# Patient Record
Sex: Female | Born: 1965 | Race: White | Hispanic: No
Health system: Midwestern US, Community
[De-identification: ages and names within clinical notes are randomized; demographics above are authoritative.]

## PROBLEM LIST (undated history)

## (undated) DIAGNOSIS — R002 Palpitations: Secondary | ICD-10-CM

## (undated) DIAGNOSIS — I10 Essential (primary) hypertension: Principal | ICD-10-CM

---

## 2007-07-11 ENCOUNTER — Emergency Department (HOSPITAL_COMMUNITY): Admission: EM | Admit: 2007-07-11 | Discharge: 2007-07-11 | Payer: Self-pay | Admitting: Emergency Medicine

## 2008-01-13 ENCOUNTER — Emergency Department (HOSPITAL_COMMUNITY): Admission: EM | Admit: 2008-01-13 | Discharge: 2008-01-14 | Payer: Self-pay | Admitting: Emergency Medicine

## 2009-02-25 ENCOUNTER — Ambulatory Visit: Payer: Self-pay | Admitting: Obstetrics and Gynecology

## 2009-02-25 ENCOUNTER — Inpatient Hospital Stay (HOSPITAL_COMMUNITY): Admission: AD | Admit: 2009-02-25 | Discharge: 2009-02-25 | Payer: Self-pay | Admitting: Obstetrics and Gynecology

## 2010-04-27 IMAGING — CR DG ABDOMEN ACUTE W/ 1V CHEST
3 series · 3 of 3 positions shown · non-contrast
Comparison: 07/11/2007

CLINICAL DATA: Nausea.  Vomiting.  Shortness of breath.  Abdominal
swelling.

ACUTE ABDOMEN SERIES (ABDOMEN 2 VIEW & CHEST 1 VIEW)

[w chest pa]
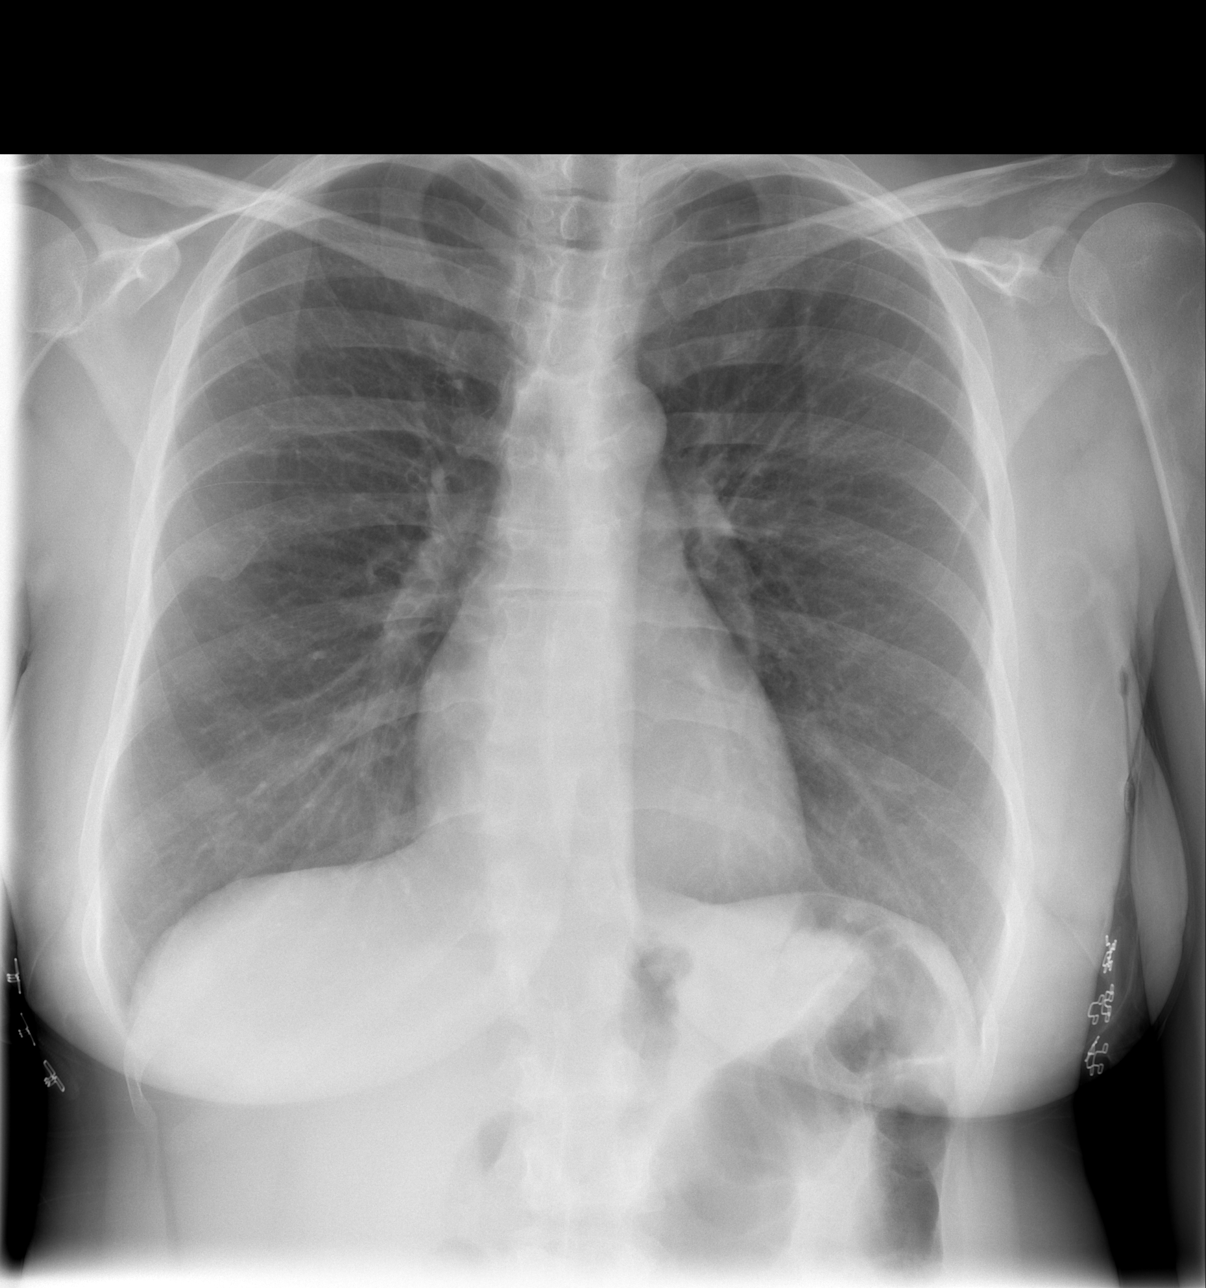

[w abdomen upright]
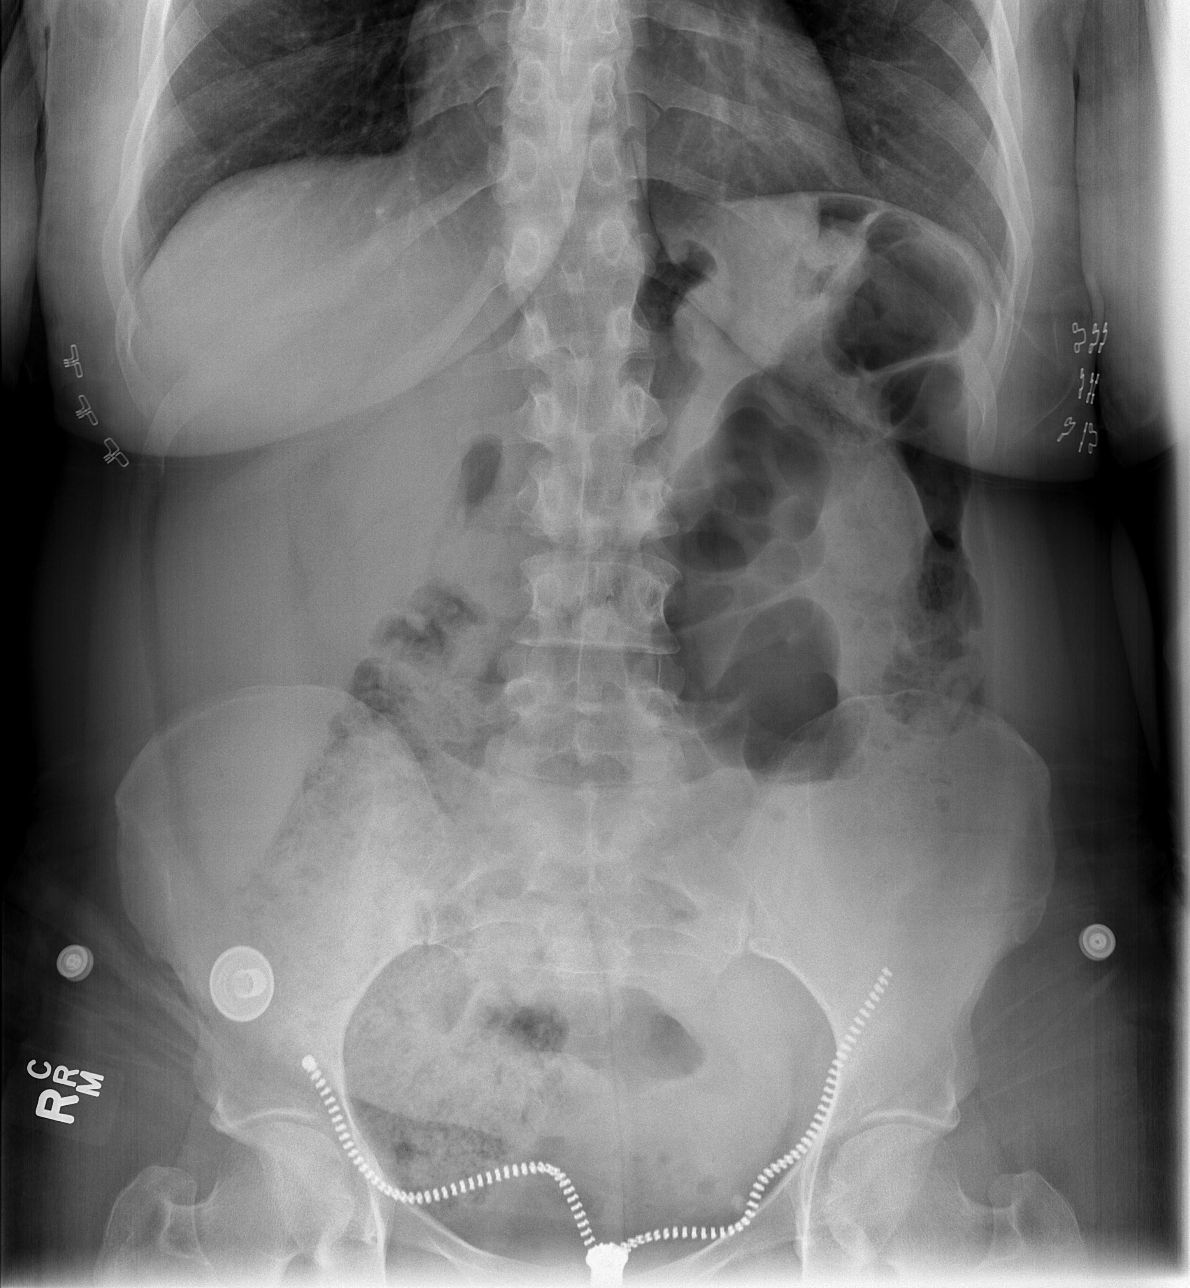

[t abdomen supine]
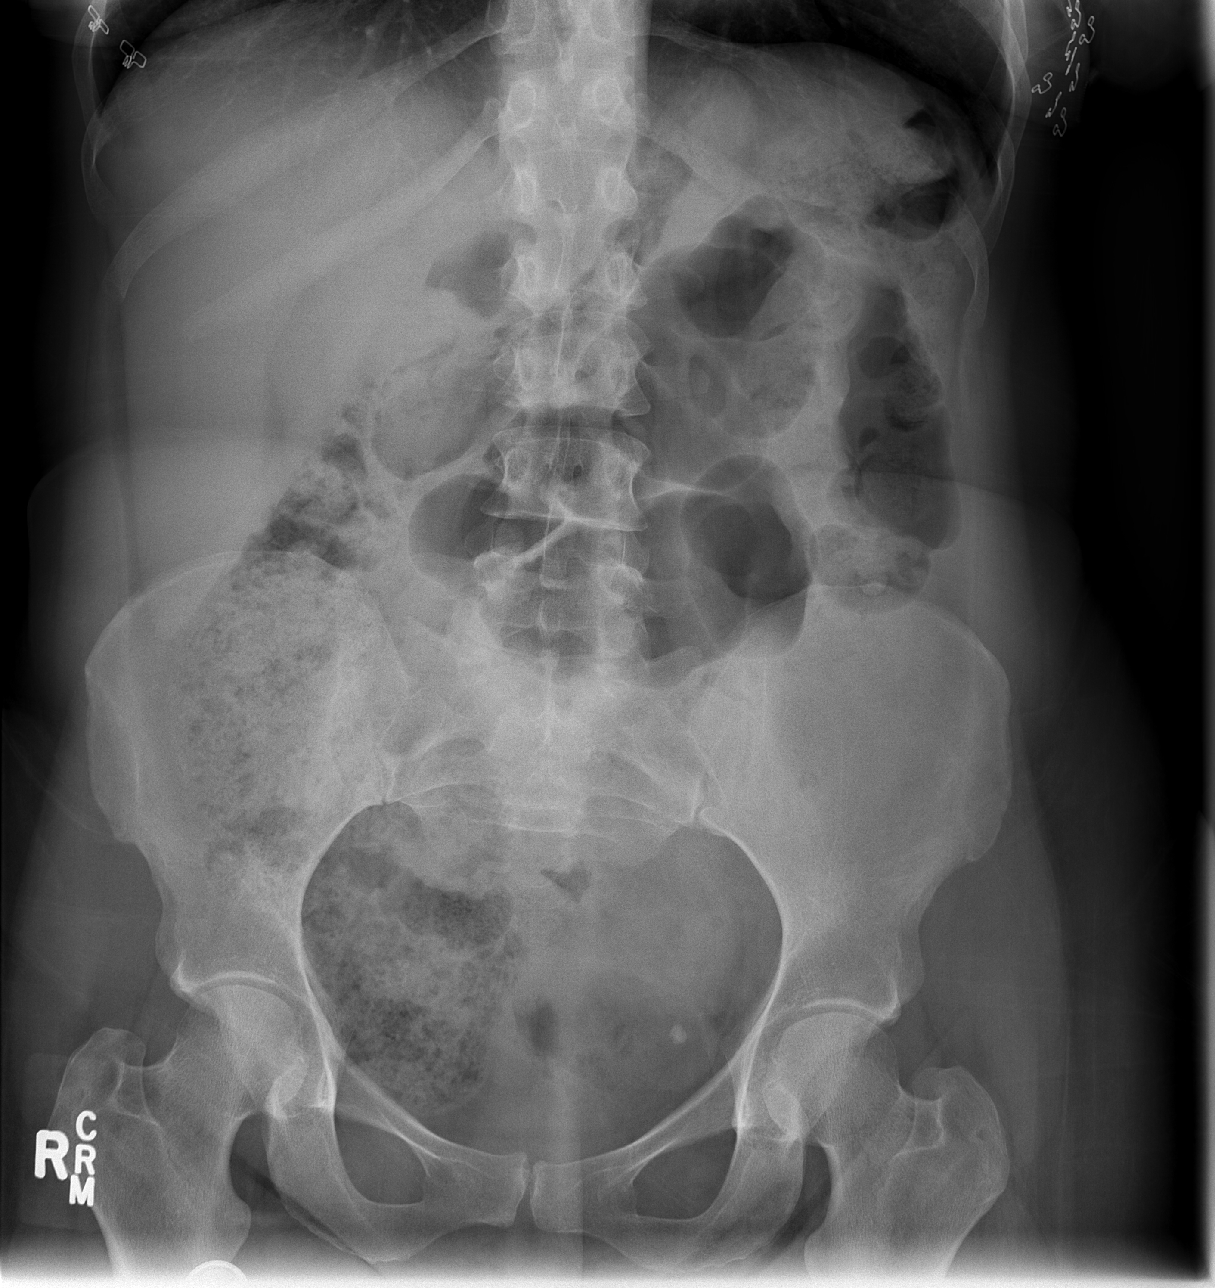

[3 of 3 positions shown; findings below may reference images not displayed]

FINDINGS: Cardiac and mediastinal contours appear unremarkable.
The lungs appear clear.

No free intraperitoneal gas is evident beneath the hemidiaphragms.
There is mild prominence of stool in the right colon with gas and
stool in the remainder of the colon.  No dilated small bowel is
identified.

No significant abnormal calcific density is identified.  Borderline
prominence of the right hepatic lobe.
IMPRESSION: 1.  Mild prominence of gas and stool in the colon, especially
proximally.  Constipation could have this appearance.
2.  No dilated small bowel to suggest obstruction.

## 2010-07-07 LAB — URINALYSIS, ROUTINE W REFLEX MICROSCOPIC
Glucose, UA: NEGATIVE mg/dL
Ketones, ur: NEGATIVE mg/dL
Nitrite: POSITIVE — AB
Protein, ur: NEGATIVE mg/dL
Specific Gravity, Urine: 1.02 (ref 1.005–1.030)
pH: 5 (ref 5.0–8.0)

## 2010-07-07 LAB — URINE MICROSCOPIC-ADD ON

## 2010-07-07 LAB — URINE CULTURE

## 2010-12-28 LAB — URINALYSIS, ROUTINE W REFLEX MICROSCOPIC
Bilirubin Urine: NEGATIVE
Ketones, ur: >80 — AB
Leukocytes, UA: NEGATIVE
Protein, ur: 30 — AB
Specific Gravity, Urine: 1.015
pH: 5.5

## 2010-12-28 LAB — CBC
MCHC: 32.2
Platelets: 233
RDW: 14.9

## 2010-12-28 LAB — POCT I-STAT, CHEM 8: Calcium, Ion: 1.12

## 2010-12-28 LAB — PREGNANCY, URINE: Preg Test, Ur: NEGATIVE

## 2010-12-28 LAB — URINE MICROSCOPIC-ADD ON

## 2010-12-28 LAB — DIFFERENTIAL
Lymphs Abs: 0.9
Monocytes Relative: 11
Neutro Abs: 4.1

## 2011-01-03 LAB — POCT I-STAT, CHEM 8
BUN: 11
Calcium, Ion: 1.17
Chloride: 109
Creatinine, Ser: 0.8
Glucose, Bld: 100 — ABNORMAL HIGH
HCT: 36
Hemoglobin: 12.2
Potassium: 4
Sodium: 140
TCO2: 24

## 2011-01-03 LAB — CBC
HCT: 34.9 — ABNORMAL LOW
Platelets: 331
WBC: 11.3 — ABNORMAL HIGH

## 2011-01-03 LAB — D-DIMER, QUANTITATIVE: D-Dimer, Quant: 0.41

## 2011-01-03 LAB — URINALYSIS, ROUTINE W REFLEX MICROSCOPIC
Ketones, ur: NEGATIVE
Nitrite: NEGATIVE
Protein, ur: NEGATIVE
Urobilinogen, UA: 0.2
pH: 5.5

## 2011-01-03 LAB — DIFFERENTIAL
Eosinophils Absolute: 0.3
Lymphs Abs: 1.9
Neutrophils Relative %: 72

## 2011-01-03 LAB — URINE CULTURE
Colony Count: NO GROWTH
Culture: NO GROWTH

## 2011-01-03 LAB — PROTIME-INR: INR: 0.9

## 2011-01-03 LAB — POCT PREGNANCY, URINE: Preg Test, Ur: NEGATIVE

## 2011-01-03 LAB — POCT CARDIAC MARKERS
CKMB, poc: 1.3
CKMB, poc: 1.6

## 2018-02-01 ENCOUNTER — Emergency Department (HOSPITAL_COMMUNITY)
Admission: EM | Admit: 2018-02-01 | Discharge: 2018-02-01 | Disposition: A | Discharge status: Home / Self Care | Payer: BLUE CROSS/BLUE SHIELD | Attending: Emergency Medicine | Admitting: Emergency Medicine

## 2018-02-01 ENCOUNTER — Emergency Department (HOSPITAL_COMMUNITY): Payer: BLUE CROSS/BLUE SHIELD

## 2018-02-01 ENCOUNTER — Encounter (HOSPITAL_COMMUNITY): Payer: Self-pay | Admitting: Emergency Medicine

## 2018-02-01 DIAGNOSIS — F17210 Nicotine dependence, cigarettes, uncomplicated: Secondary | ICD-10-CM | POA: Insufficient documentation

## 2018-02-01 DIAGNOSIS — J069 Acute upper respiratory infection, unspecified: Secondary | ICD-10-CM | POA: Diagnosis not present

## 2018-02-01 DIAGNOSIS — R05 Cough: Secondary | ICD-10-CM | POA: Diagnosis present

## 2018-02-01 LAB — INFLUENZA PANEL BY PCR (TYPE A & B)
Influenza A By PCR: NEGATIVE
Influenza B By PCR: NEGATIVE

## 2018-02-01 LAB — CBC WITH DIFFERENTIAL/PLATELET
ABS IMMATURE GRANULOCYTES: 0.02 10*3/uL (ref 0.00–0.07)
BASOS ABS: 0 10*3/uL (ref 0.0–0.1)
Basophils Relative: 0 %
EOS PCT: 1 %
Eosinophils Absolute: 0 10*3/uL (ref 0.0–0.5)
HCT: 33.1 % — ABNORMAL LOW (ref 36.0–46.0)
Hemoglobin: 10.8 g/dL — ABNORMAL LOW (ref 12.0–15.0)
IMMATURE GRANULOCYTES: 0 %
Lymphocytes Relative: 24 %
Lymphs Abs: 1.7 10*3/uL (ref 0.7–4.0)
MCH: 26 pg (ref 26.0–34.0)
MCHC: 32.6 g/dL (ref 30.0–36.0)
MCV: 79.6 fL — ABNORMAL LOW (ref 80.0–100.0)
Monocytes Absolute: 0.7 10*3/uL (ref 0.1–1.0)
Monocytes Relative: 10 %
NEUTROS ABS: 4.7 10*3/uL (ref 1.7–7.7)
NEUTROS PCT: 65 %
NRBC: 0 % (ref 0.0–0.2)
Platelets: 278 10*3/uL (ref 150–400)
RBC: 4.16 MIL/uL (ref 3.87–5.11)
RDW: 14 % (ref 11.5–15.5)
WBC: 7.3 10*3/uL (ref 4.0–10.5)

## 2018-02-01 LAB — COMPREHENSIVE METABOLIC PANEL
ALBUMIN: 4.2 g/dL (ref 3.5–5.0)
ALK PHOS: 56 U/L (ref 38–126)
ALT: 12 U/L (ref 0–44)
ANION GAP: 6 (ref 5–15)
AST: 18 U/L (ref 15–41)
BUN: 9 mg/dL (ref 6–20)
CALCIUM: 9.3 mg/dL (ref 8.9–10.3)
CO2: 25 mmol/L (ref 22–32)
Chloride: 107 mmol/L (ref 98–111)
Creatinine, Ser: 0.78 mg/dL (ref 0.44–1.00)
GFR calc Af Amer: >60 mL/min (ref >60)
GFR calc non Af Amer: >60 mL/min (ref >60)
GLUCOSE: 90 mg/dL (ref 70–99)
POTASSIUM: 3.6 mmol/L (ref 3.5–5.1)
SODIUM: 138 mmol/L (ref 135–145)
Total Bilirubin: 0.5 mg/dL (ref 0.3–1.2)
Total Protein: 7.7 g/dL (ref 6.5–8.1)

## 2018-02-01 MED ORDER — IBUPROFEN 800 MG PO TABS: 800.0000 mg | ORAL_TABLET | Freq: Three times a day (TID) | ORAL | 0 refills | Status: AC | PRN

## 2018-02-01 MED ORDER — DOXYCYCLINE HYCLATE 100 MG PO CAPS: 100.0000 mg | ORAL_CAPSULE | Freq: Two times a day (BID) | ORAL | 0 refills | Status: AC

## 2018-02-01 MED ORDER — ONDANSETRON HCL 4 MG/2ML IJ SOLN
4.0000 mg | Freq: Once | INTRAMUSCULAR | Status: AC
  Administered 2018-02-01: 4 mg via INTRAVENOUS
  Filled 2018-02-01: qty 2

## 2018-02-01 MED ORDER — KETOROLAC TROMETHAMINE 30 MG/ML IJ SOLN
30.0000 mg | Freq: Once | INTRAMUSCULAR | Status: AC
  Administered 2018-02-01: 30 mg via INTRAVENOUS
  Filled 2018-02-01: qty 1

## 2018-02-01 MED ORDER — ONDANSETRON 4 MG PO TBDP: ORAL_TABLET | ORAL | 0 refills | Status: AC

## 2018-02-01 MED ORDER — SODIUM CHLORIDE 0.9 % IV BOLUS
1000.0000 mL | Freq: Once | INTRAVENOUS | Status: AC
  Administered 2018-02-01: 1000 mL via INTRAVENOUS

## 2018-02-01 NOTE — ED Triage Notes (Addendum)
Pt c/o being fatigue, headache cough, body aches for about week. Pt was told boarderline diabetic when checked this morning fasting was 89. Pt also having anxiousness and SOb at times. reports lots of sweating last night.  Pt adds that she has fibromyalgia and assumes that is what her pains are from

## 2018-02-01 NOTE — ED Notes (Signed)
Patient transported to X-ray 

## 2018-02-01 NOTE — Discharge Instructions (Addendum)
Drink plenty of fluids.  Take Tylenol for fever.  Follow-up with your family doctor if not improving

## 2018-02-01 NOTE — ED Provider Notes (Signed)
[facility]-EMERGENCY DEPT Provider Note   CSN: [id number] Arrival date & time: 02/01/18  0941     History   Chief Complaint Chief Complaint  Patient presents with  . Fatigue  . Anxiety  . Cough  . Headache    HPI April Morris is a 52 y.o. female.  Patient complains of cough congestion and aching all over  The history is provided by the patient. No language interpreter was used.  Cough  This is a new problem. The current episode started 2 days ago. The problem occurs constantly. The problem has not changed since onset.The cough is non-productive. There has been no fever (No fever). Pertinent negatives include no chest pain and no headaches. She has tried nothing for the symptoms. The treatment provided no relief. Risk factors: Unknown. She is not a smoker. Her past medical history does not include pneumonia.    History reviewed. No pertinent past medical history.  There are no active problems to display for this patient.   History reviewed. No pertinent surgical history.   OB History   None      Home Medications    Prior to Admission medications   Medication Sig Start Date End Date Taking? Authorizing Provider  ALPRAZolam Prudy Feeler) 1 MG tablet Take 1 mg by mouth 3 (three) times daily as needed for anxiety.   Yes [provider]  cholecalciferol (VITAMIN D) 1000 units tablet Take 1,000 Units by mouth once a week.   Yes [provider]  magnesium 30 MG tablet Take 30 mg by mouth once.   Yes [provider]  omeprazole (PRILOSEC) 40 MG capsule Take 40 mg by mouth daily.   Yes [provider]  doxycycline (VIBRAMYCIN) 100 MG capsule Take 1 capsule (100 mg total) by mouth 2 (two) times daily. One po bid x 7 days 02/01/18   Bethann Berkshire, MD  ibuprofen (ADVIL,MOTRIN) 800 MG tablet Take 1 tablet (800 mg total) by mouth every 8 (eight) hours as needed. 02/01/18   Bethann Berkshire, MD  ondansetron (ZOFRAN ODT) 4 MG  disintegrating tablet 4mg  ODT q4 hours prn nausea/vomit 02/01/18   Bethann Berkshire, MD    Family History No family history on file.  Social History Social History   Tobacco Use  . Smoking status: Current Every Day Smoker    Types: Cigarettes  . Smokeless tobacco: Never Used  Substance Use Topics  . Alcohol use: Not on file  . Drug use: Not on file     Allergies   Patient has no known allergies.   Review of Systems Review of Systems  Constitutional: Negative for appetite change and fatigue.  HENT: Negative for congestion, ear discharge and sinus pressure.   Eyes: Negative for discharge.  Respiratory: Positive for cough.   Cardiovascular: Negative for chest pain.  Gastrointestinal: Negative for abdominal pain and diarrhea.  Genitourinary: Negative for frequency and hematuria.  Musculoskeletal: Negative for back pain.  Skin: Negative for rash.  Neurological: Negative for seizures and headaches.  Psychiatric/Behavioral: Negative for hallucinations.     Physical Exam Updated Vital Signs BP 115/77   Pulse 74   Temp 99.3 F (37.4 C) (Oral)   Resp 15   SpO2 100%   Physical Exam  Constitutional: She is oriented to person, place, and time. She appears well-developed.  HENT:  Head: Normocephalic.  Eyes: Conjunctivae and EOM are normal. No scleral icterus.  Neck: Neck supple. No thyromegaly present.  Cardiovascular: Normal rate and regular rhythm. Exam  reveals no gallop and no friction rub.  No murmur heard. Pulmonary/Chest: No stridor. She has no wheezes. She has no rales. She exhibits no tenderness.  Abdominal: She exhibits no distension. There is no tenderness. There is no rebound.  Musculoskeletal: Normal range of motion. She exhibits no edema.  Lymphadenopathy:    She has no cervical adenopathy.  Neurological: She is oriented to person, place, and time. She exhibits normal muscle tone. Coordination normal.  Skin: No rash noted. No erythema.  Psychiatric: She has  a normal mood and affect. Her behavior is normal.     ED Treatments / Results  Labs (all labs ordered are listed, but only abnormal results are displayed) Labs Reviewed  CBC WITH DIFFERENTIAL/PLATELET - Abnormal; Notable for the following components:      Result Value   Hemoglobin 10.8 (*)    HCT 33.1 (*)    MCV 79.6 (*)    All other components within normal limits  COMPREHENSIVE METABOLIC PANEL  INFLUENZA PANEL BY PCR (TYPE A & B)    EKG None  Radiology Dg Chest 2 View  Result Date: 02/01/2018 CLINICAL DATA:  Increased weakness over the last 24 hours, some in is i.e., cough, slight shortness of breath EXAM: CHEST - 2 VIEW COMPARISON:  Chest x-ray of 12/16/2014 FINDINGS: No pneumonia or effusion is seen. The lungs are slightly hyperaerated. There is some peribronchial thickening however which may indicate bronchitis. Mediastinal and hilar contours are unremarkable. The heart is within normal limits in size. No bony abnormality is seen. IMPRESSION: 1. Peribronchial thickening may indicate bronchitis. 2. Slight hyper aeration.  No pneumonia or pleural effusion. Electronically Signed   By: Dwyane Dee M.D.   On: 02/01/2018 12:22    Procedures Procedures (including critical care time)  Medications Ordered in ED Medications  sodium chloride 0.9 % bolus 1,000 mL (1,000 mLs Intravenous New Bag/Given 02/01/18 1151)  ondansetron (ZOFRAN) injection 4 mg (4 mg Intravenous Given 02/01/18 1152)  ketorolac (TORADOL) 30 MG/ML injection 30 mg (30 mg Intravenous Given 02/01/18 1152)     Initial Impression / Assessment and Plan / ED Course  I have reviewed the triage vital signs and the nursing notes.  Pertinent labs & imaging results that were available during my care of the patient were reviewed by me and considered in my medical decision making (see chart for details).     Patient with bronchitis.  Labs unremarkable.  Patient will be placed on doxycycline Motrin and Zofran will  follow-up with PCP  Final Clinical Impressions(s) / ED Diagnoses   Final diagnoses:  Viral upper respiratory tract infection    ED Discharge Orders         Ordered    doxycycline (VIBRAMYCIN) 100 MG capsule  2 times daily     02/01/18 1311    ibuprofen (ADVIL,MOTRIN) 800 MG tablet  Every 8 hours PRN     02/01/18 1311    ondansetron (ZOFRAN ODT) 4 MG disintegrating tablet     02/01/18 1311           Bethann Berkshire, MD 02/01/18 1319

## 2018-02-01 NOTE — ED Notes (Signed)
ED Provider at bedside. 

## 2020-05-16 IMAGING — CR DG CHEST 2V
2 series · 2 of 2 positions shown · non-contrast
Comparison: Chest x-ray of 12/16/2014

CLINICAL DATA: Increased weakness over the last 24 hours, some in
is i.e., cough, slight shortness of breath

EXAM:
CHEST - 2 VIEW

[x chest ap]
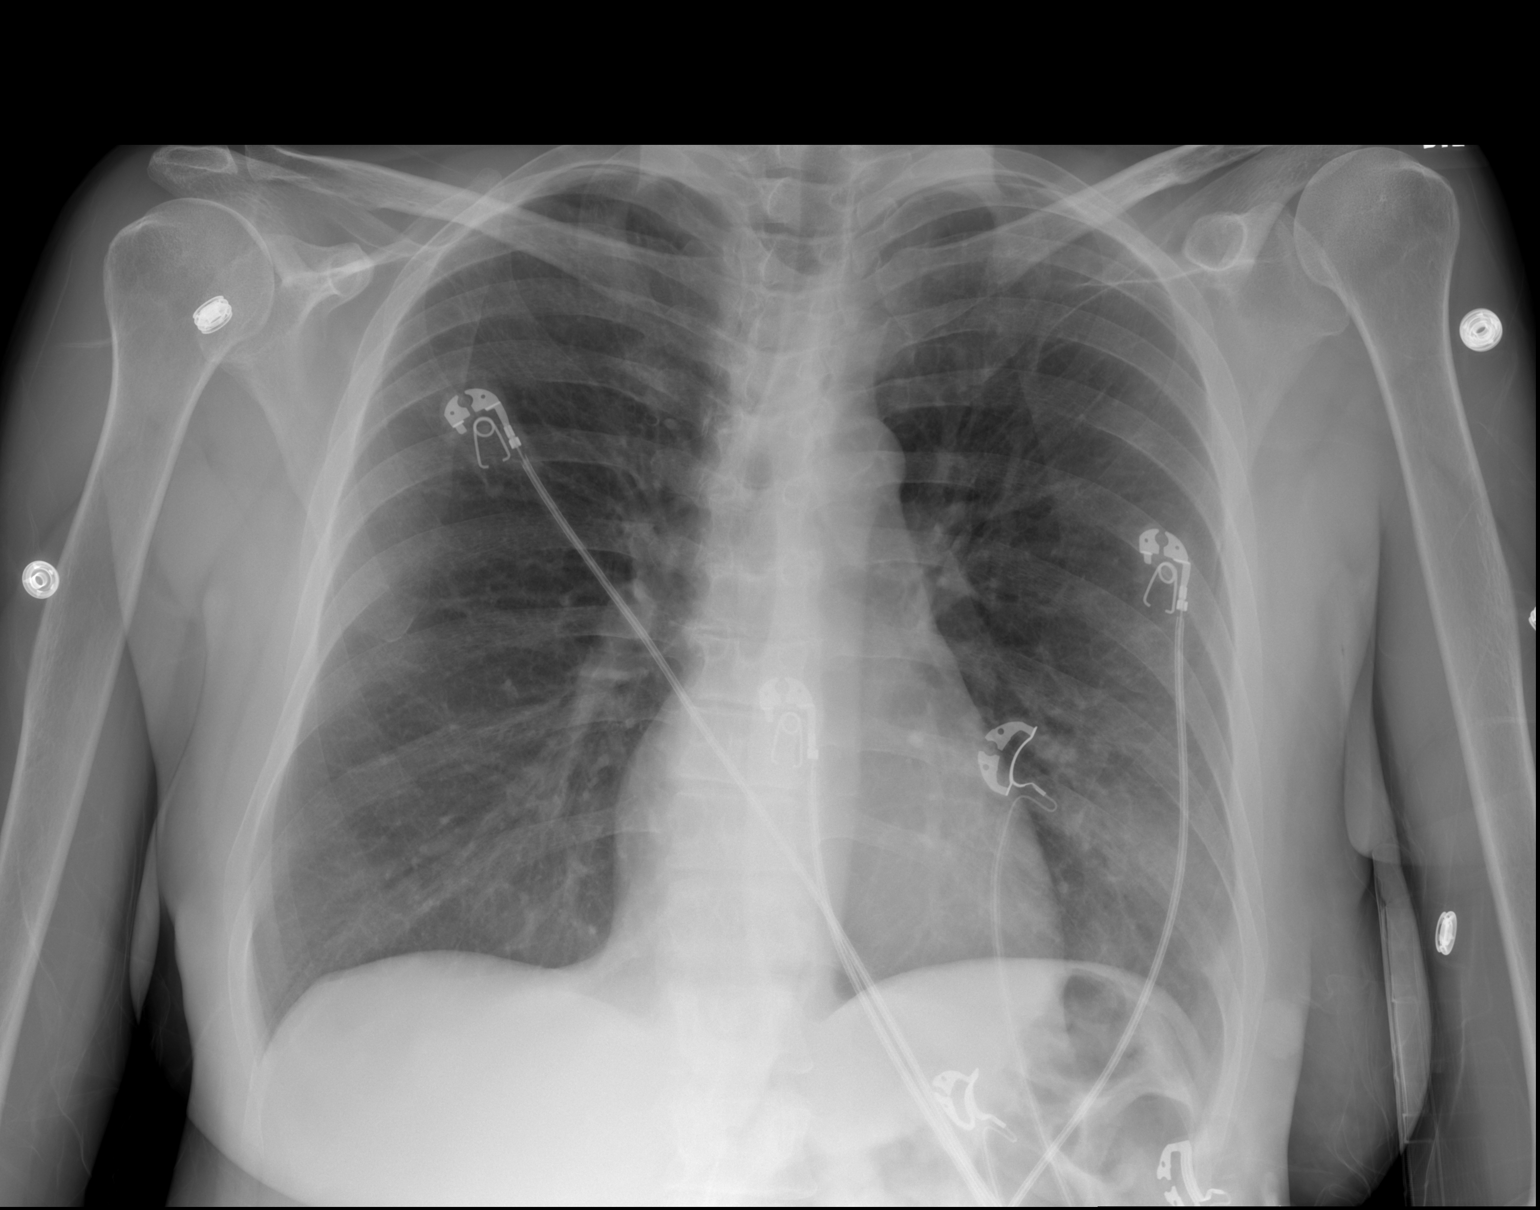

[w chest lat]
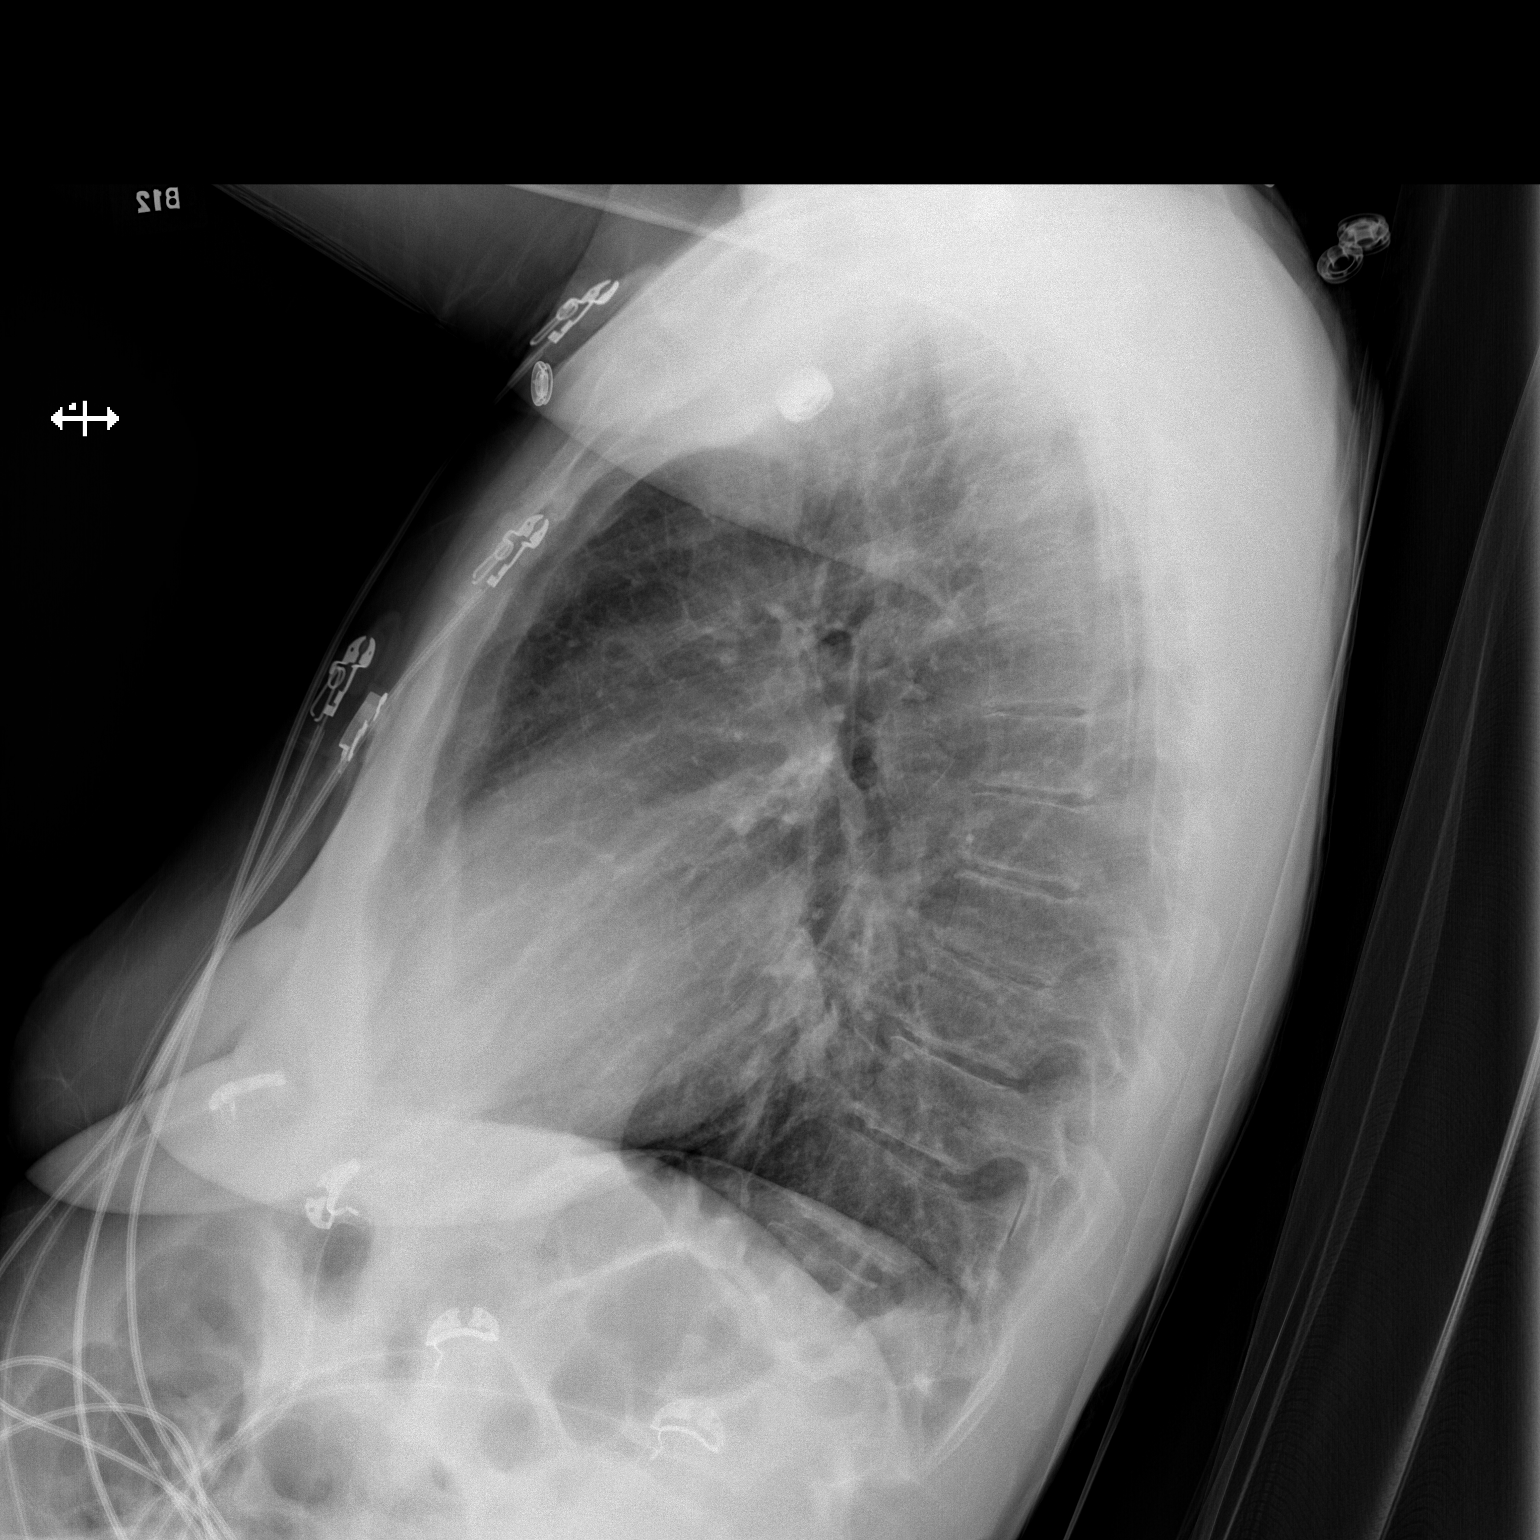

[2 of 2 positions shown; findings below may reference images not displayed]

FINDINGS: No pneumonia or effusion is seen. The lungs are slightly
hyperaerated. There is some peribronchial thickening however which
may indicate bronchitis. Mediastinal and hilar contours are
unremarkable. The heart is within normal limits in size. No bony
abnormality is seen.
IMPRESSION: 1. Peribronchial thickening may indicate bronchitis.
2. Slight hyper aeration.  No pneumonia or pleural effusion.

## 2022-05-13 ENCOUNTER — Ambulatory Visit: Admit: 2022-05-13 | Discharge: 2022-05-13 | Payer: BLUE CROSS/BLUE SHIELD | Attending: Internal Medicine

## 2022-05-13 ENCOUNTER — Encounter

## 2022-05-13 DIAGNOSIS — I10 Essential (primary) hypertension: Secondary | ICD-10-CM

## 2022-05-13 NOTE — Progress Notes (Signed)
Patient: Krista Werner  DOB: Jul 20, 1965    Primary Cardiologist: Loma Sousa, MD  EP Cardiologist:  PCP: None, None    Today's Date: 05/13/2022      ASSESSMENT AND PLAN:     Assessment and Plan:  SOB/palpitations  Echo   7 day holter  CCS  Full labs    2. HTN  Controlled on lisinopril 5    Follow up with after above testing      ICD-10-CM    1. Hypertension, unspecified type  I10 EKG 12 Lead      2. Shortness of breath  R06.02       3. Other fatigue  R53.83           HISTORY OF PRESENT ILLNESS:     History of Present Illness:  Krista Werner is a 57 y.o. female here to re-establihs care.  Seen at my prior practie 2018.  Nuc stress at that time rpeortedly normal (do not have results to review)    Dizzy SOB, BP up and down.  Mother COPD 35 yoa.      FH CAD.    Aunt CAD.      Stress - lost daughter.   Family dynamics.      EKG NSR.      PAST MEDICAL HISTORY:     History reviewed. No pertinent past medical history.    History reviewed. No pertinent surgical history.    CURRENT MEDICATIONS:    .  Current Outpatient Medications   Medication Sig Dispense Refill    lisinopril (PRINIVIL;ZESTRIL) 10 MG tablet Take 0.5 tablets by mouth daily      omeprazole (PRILOSEC) 40 MG delayed release capsule TAKE 1 CAPSULE BY MOUTH ONCE DAILY NEEDS A FOLLOW UP APPOINTMENT WITH PCP FOR FUTURE REFILLS      sertraline (ZOLOFT) 50 MG tablet Take 1 tablet by mouth daily       No current facility-administered medications for this visit.       No Known Allergies    SOCIAL HISTORY:          FAMILY HISTORY:     History reviewed. No pertinent family history.    REVIEW OF SYMPTOMS:     Review of Symptoms:  Negative except as above, all other systems reviewed and are negative for a Comprehensive ROS (10+)    PHYSICAL EXAM:     Physical Exam:  BP 120/80   Pulse 66   Resp 16   Ht 1.549 m (5\' 1" )   Wt 85.3 kg (188 lb)   SpO2 96%   BMI 35.52 kg/m     General: alert, cooperative, no distress, appears stated age  Neck: supple,  symmetrical, trachea midline, no adenopathy, thyroid: not enlarged, symmetric, no tenderness/mass/nodules, no carotid bruit, and no JVD  Lungs: clear to auscultation bilaterally  Heart: regular rate and rhythm, S1, S2 normal, no murmur, no click, rub or gallop  Abdomen: soft, non tender  Extremities: extremities normal, atraumatic, no cyanosis or edema  Skin: No significant rashes  MSKTL: Overall good ROM ext  Neuro: Grossly intact  Psych: Appropriate affect    LABS / OTHER STUDIES:     No results found for: "NA", "K", "CL", "CO2", "AGAP", "GLU", "BUN", "CREA", "GFRAA", "CA", "TP", "ALB", "GLOB", "ALT", "AST"    No results found for: "CHOL", "CHOLPOCT", "CHOLX", "CHLST", "CHOLV", "TOTCHOLEXT", "HDL", "[place]", "HDLEXT", "HDLC", "LDL", "LDLCEXT", "LDLC", "VLDLC", "VLDL", "TGLX", "TRIGL", "TRIGLYCEXT"    No results found for: "CHOL", "TRIG", "HDL", "LDLCALC", "  SMALLLDLP", "LDLNMR", "HDLNMR", "TRIGLYNRM"      CARDIAC DIAGNOSTICS:     Cardiac Evaluation Includes:  I reviewed the test results below.  No results found for this or any previous visit.        No results found for this or any previous visit.      No results found for this or any previous visit.       No future appointments.      Loma Sousa, MD    [facility]    [address], [place]    [place], [place]    Ph: [phone]

## 2022-05-13 NOTE — Patient Instructions (Signed)
Your prescriber has ordered and echocardiogram and a Coronary calcium score test.  Please call central scheduling to schedule your coronary calcium test at [phone]  Please get your blood work done today.

## 2022-05-13 NOTE — Addendum Note (Signed)
Addended byMelina Modena on: 05/13/2022 04:25 PM     Modules accepted: Orders

## 2022-05-14 LAB — COMPREHENSIVE METABOLIC PANEL
ALT: 28 U/L (ref 12–78)
AST: 24 U/L (ref 15–37)
Albumin/Globulin Ratio: 1.3 (ref 1.1–2.2)
Albumin: 4 g/dL (ref 3.5–5.0)
Alk Phosphatase: 78 U/L (ref 45–117)
Anion Gap: 5 mmol/L (ref 5–15)
BUN: 9 MG/DL (ref 6–20)
Bun/Cre Ratio: 16 (ref 12–20)
CO2: 27 mmol/L (ref 21–32)
Calcium: 9.4 MG/DL (ref 8.5–10.1)
Chloride: 104 mmol/L (ref 97–108)
Creatinine: 0.56 MG/DL (ref 0.55–1.02)
Est, Glom Filt Rate: >60 mL/min/{1.73_m2} (ref >60)
Globulin: 3.1 g/dL (ref 2.0–4.0)
Glucose: 81 mg/dL (ref 65–100)
Potassium: 4.1 mmol/L (ref 3.5–5.1)
Sodium: 136 mmol/L (ref 136–145)
Total Bilirubin: 0.5 MG/DL (ref 0.2–1.0)
Total Protein: 7.1 g/dL (ref 6.4–8.2)

## 2022-05-14 LAB — CBC
Hematocrit: 42.8 % (ref 35.0–47.0)
Hemoglobin: 14.3 g/dL (ref 11.5–16.0)
MCH: 29.4 PG (ref 26.0–34.0)
MCHC: 33.4 g/dL (ref 30.0–36.5)
MCV: 87.9 FL (ref 80.0–99.0)
MPV: 10.7 FL (ref 8.9–12.9)
Nucleated RBCs: 0 PER 100 WBC
Platelets: 288 10*3/uL (ref 150–400)
RBC: 4.87 M/uL (ref 3.80–5.20)
RDW: 12.6 % (ref 11.5–14.5)
WBC: 9.8 10*3/uL (ref 3.6–11.0)
nRBC: 0 10*3/uL (ref 0.00–0.01)

## 2022-05-14 LAB — LIPID PANEL
Chol/HDL Ratio: 3.5 (ref 0.0–5.0)
Cholesterol, Total: 186 MG/DL (ref <200)
HDL: 53 MG/DL
LDL Calculated: 110 MG/DL — ABNORMAL HIGH (ref 0–100)
Triglycerides: 115 MG/DL (ref <150)
VLDL Cholesterol Calculated: 23 MG/DL

## 2022-05-14 LAB — TSH: TSH, 3RD GENERATION: 1.68 u[IU]/mL (ref 0.36–3.74)

## 2022-05-16 ENCOUNTER — Ambulatory Visit: Admit: 2022-05-16 | Payer: BLUE CROSS/BLUE SHIELD

## 2022-05-16 DIAGNOSIS — R002 Palpitations: Secondary | ICD-10-CM

## 2022-05-16 NOTE — Telephone Encounter (Signed)
Enrolled with [employer] - Ordered and being shipped to patient's home address on file.  ETA within 5-7 business days.    Message  Received: 3 days ago  [name], LPN  Krista Werner  Dr. Orma Flaming has ordered a 7 days Holter for Palpitations for above patient. thanks

## 2022-05-16 NOTE — Other (Signed)
Yenesis,    Your labs look good.     Dr. Orma Flaming

## 2022-05-24 ENCOUNTER — Inpatient Hospital Stay: Admit: 2022-05-24 | Attending: Internal Medicine

## 2022-05-24 DIAGNOSIS — I10 Essential (primary) hypertension: Secondary | ICD-10-CM

## 2022-05-24 NOTE — Other (Signed)
Hi Ms. Teuscher,    Your coronary calcium score is 18, overall low and indicates low risk.    We will discuss more at follow-up.    Dr. Reginia Forts

## 2022-05-24 NOTE — Other (Signed)
Krista Werner,    Your coronary calcium score is low @ 18.    Dr. Orma Flaming

## 2022-06-15 ENCOUNTER — Encounter

## 2022-06-15 NOTE — Nursing Note (Signed)
MONITOR DIDN'T WORK - NEEDS TO BE REPEATED

## 2022-06-20 ENCOUNTER — Ambulatory Visit: Admit: 2022-06-20 | Discharge: 2022-06-27 | Payer: BLUE CROSS/BLUE SHIELD

## 2022-06-20 ENCOUNTER — Ambulatory Visit: Admit: 2022-06-20 | Discharge: 2022-07-07 | Payer: BLUE CROSS/BLUE SHIELD

## 2022-06-20 DIAGNOSIS — R002 Palpitations: Secondary | ICD-10-CM

## 2022-06-20 DIAGNOSIS — I10 Essential (primary) hypertension: Secondary | ICD-10-CM

## 2022-06-20 MED ORDER — PERFLUTREN LIPID MICROSPHERE IV SUSP
0.9 | Freq: Once | INTRAVENOUS | Status: AC | PRN
Start: 2022-06-20 — End: 2022-06-20
  Administered 2022-06-20: 16:00:00 1.3 mL via INTRAVENOUS

## 2022-06-21 LAB — ECHO (TTE) COMPLETE (PRN CONTRAST/BUBBLE/STRAIN/3D)
Ao Root Index: 1.63 cm/m2
Aortic Root: 3 cm
Ascending Aorta Index: 1.52 cm/m2
Ascending Aorta: 2.8 cm
Body Surface Area: 1.92 m2
E/E' Lateral: 7.6
E/E' Ratio (Averaged): 8.55
EF BP: 63 % (ref 55–100)
Fractional Shortening 2D: 36 % (ref 28–44)
IVSd: 1 cm — AB (ref 0.6–0.9)
LA Diameter: 3.9 cm
LA Size Index: 2.12 cm/m2
LA/AO Root Ratio: 1.3
LV E' Lateral Velocity: 10 cm/s
LV E' Septal Velocity: 8 cm/s
LV EDV A2C: 45 mL
LV EDV A4C: 47 mL
LV EDV BP: 46 mL — AB (ref 56–104)
LV EDV Index A2C: 24 mL/m2
LV EDV Index A4C: 26 mL/m2
LV EDV Index BP: 25 mL/m2
LV ESV A2C: 21 mL
LV ESV A4C: 13 mL
LV ESV BP: 17 mL — AB (ref 19–49)
LV ESV Index A2C: 11 mL/m2
LV ESV Index A4C: 7 mL/m2
LV ESV Index BP: 9 mL/m2
LV Ejection Fraction A2C: 54 %
LV Ejection Fraction A4C: 72 %
LV Mass 2D Index: 83.3 g/m2 (ref 43–95)
LV Mass 2D: 153.3 g (ref 67–162)
LV RWT Ratio: 0.44
LVIDd Index: 2.45 cm/m2
LVIDd: 4.5 cm (ref 3.9–5.3)
LVIDs Index: 1.58 cm/m2
LVIDs: 2.9 cm
LVPWd: 1 cm — AB (ref 0.6–0.9)
MV A Velocity: 0.78 m/s
MV Area by PHT: 3.8 cm2
MV E Velocity: 0.76 m/s
MV E Wave Deceleration Time: 197.3 ms
MV E/A: 0.97
MV PHT: 57.2 ms
RV Free Wall Peak S': 10 cm/s
TAPSE: 2 cm (ref 1.7–?)

## 2022-07-04 LAB — EXTENDED CARDIAC HOLTER MONITOR: Body Surface Area: 1.92 m2

## 2022-07-26 ENCOUNTER — Ambulatory Visit: Admit: 2022-07-26 | Discharge: 2022-07-26 | Payer: BLUE CROSS/BLUE SHIELD | Attending: Internal Medicine

## 2022-07-26 DIAGNOSIS — I10 Essential (primary) hypertension: Secondary | ICD-10-CM

## 2022-07-26 MED ORDER — LISINOPRIL 5 MG PO TABS
5 MG | ORAL_TABLET | Freq: Every day | ORAL | 3 refills | Status: DC
Start: 2022-07-26 — End: 2023-07-14

## 2022-07-26 NOTE — Progress Notes (Signed)
Per Dr. Dietz- follow up one year.

## 2022-07-26 NOTE — Progress Notes (Signed)
Chief Complaint   Patient presents with    Hypertension    Shortness of Breath    Fatigue     Vitals:    07/26/22 1043   BP: 118/70   Site: Left Upper Arm   Position: Sitting   Cuff Size: Medium Adult   Pulse: 74   SpO2: 97%   Weight: 89.4 kg (197 lb)   Height: 1.549 m ( )      BP 118/70 (Site: Left Upper Arm, Position: Sitting, Cuff Size: Medium Adult)   Pulse 74   Ht 1.549 m ( )   Wt 89.4 kg (197 lb)   SpO2 97%   BMI 37.22 kg/m

## 2022-07-26 NOTE — Progress Notes (Unsigned)
Patient: Krista Werner  DOB: 06/29/1965    Primary Cardiologist: Kathaleen Bury, MD  EP Cardiologist:  PCP: No, Pcp    Today's Date: 07/26/2022      ASSESSMENT AND PLAN:     Assessment and Plan:  SOB/palpitations  Echo: Left Ventricle: Normal left ventricular systolic function with a visually estimated EF of 60 - 65%. EF by 2D Simpsons Biplane is 63%. Left ventricle size is normal. Normal wall thickness. Normal wall motion. Normal diastolic function.    Mitral Valve: Mild regurgitation.    Image quality is technically difficult. Contrast used: Definity. Technically difficult study with poor endocardial visualization.  7 day holter:  Patient monitored for 6d 19h, analyzable time was 6d 19h starting on 06/20/2022 11:50 am.   Primary rhythm was Sinus Rhythm. Average heart rate was 70 bpm, Minimum heart rate was 50 bpm on Day 7 / 03:55:52 am, Max heart rate was 132 bpm on Day 3 / 07:11:08 am   SVE(s): Burden was 0.05 %, 342 total SVE(s)   SV Arrhythmia(s): 16 event(s), longest event 11 beats on Day 6 / 06:50:37 pm, fastest event 146 bpm on Day 2 / 03:42:32 pm   PVC(s): Burden was < 0.01 %, 5 total PVC(s), 3 disparate morphologies   Patient recorded 7 event(s) during the monitoring period   CCS 18  LDL 110  Discussed risks and benefits of statins - she would like to retry lifestyle changes    2. HTN  Controlled on lisinopril 5 (0.5 )    Follow up one year.       ICD-10-CM    1. Hypertension, unspecified type  I10 lisinopril (PRINIVIL;ZESTRIL) 5 MG tablet            HISTORY OF PRESENT ILLNESS:     History of Present Illness:  Krista Werner is a 57 y.o. female here to re-establihs care.  Seen at my prior practie 2018.  Nuc stress at that time rpeortedly normal (do not have results to review)    Dizzy SOB, BP up and down.  Mother COPD 72 yoa.      FH CAD.    Aunt CAD.      Stress - lost daughter.   Family dynamics.      EKG NSR.      PAST MEDICAL HISTORY:     History reviewed. No pertinent past medical  history.    History reviewed. No pertinent surgical history.    CURRENT MEDICATIONS:    .  Current Outpatient Medications   Medication Sig Dispense Refill    lisinopril (PRINIVIL;ZESTRIL) 5 MG tablet Take 1 tablet by mouth daily 90 tablet 3    omeprazole (PRILOSEC) 40 MG delayed release capsule TAKE 1 CAPSULE BY MOUTH ONCE DAILY NEEDS A FOLLOW UP APPOINTMENT WITH PCP FOR FUTURE REFILLS      sertraline (ZOLOFT) 50 MG tablet Take 1 tablet by mouth daily       No current facility-administered medications for this visit.       No Known Allergies    SOCIAL HISTORY:     Social History     Tobacco Use    Smoking status: Never    Smokeless tobacco: Never       FAMILY HISTORY:     History reviewed. No pertinent family history.    REVIEW OF SYMPTOMS:     Review of Symptoms:  Negative except as above, all other systems reviewed and are negative for a Comprehensive ROS (10+)  PHYSICAL EXAM:     Physical Exam:  BP 118/70 (Site: Left Upper Arm, Position: Sitting, Cuff Size: Medium Adult)   Pulse 74   Ht 1.549 m (5\' 1" )   Wt 89.4 kg (197 lb)   SpO2 97%   BMI 37.22 kg/m     General: alert, cooperative, no distress, appears stated age  Neck: supple, symmetrical, trachea midline, no adenopathy, thyroid: not enlarged, symmetric, no tenderness/mass/nodules, no carotid bruit, and no JVD  Lungs: clear to auscultation bilaterally  Heart: regular rate and rhythm, S1, S2 normal, no murmur, no click, rub or gallop  Abdomen: soft, non tender  Extremities: extremities normal, atraumatic, no cyanosis or edema  Skin: No significant rashes  MSKTL: Overall good ROM ext  Neuro: Grossly intact  Psych: Appropriate affect    LABS / OTHER STUDIES:     Lab Results   Component Value Date/Time    NA 136 05/13/2022 02:46 PM    K 4.1 05/13/2022 02:46 PM    CL 104 05/13/2022 02:46 PM    CO2 27 05/13/2022 02:46 PM    BUN 9 05/13/2022 02:46 PM    GLOB 3.1 05/13/2022 02:46 PM    ALT 28 05/13/2022 02:46 PM    AST 24 05/13/2022 02:46 PM       Lab Results    Component Value Date/Time    CHOL 186 05/13/2022 02:46 PM    HDL 53 05/13/2022 02:46 PM       Cholesterol, Total   Date Value Ref Range Status   05/13/2022 186 <200 MG/DL Final     Triglycerides   Date Value Ref Range Status   05/13/2022 115 <150 MG/DL Final     Comment:     Based on NCEP-ATP III:  Triglycerides <150 mg/dL  is considered normal, 150-199 mg/dL  borderline high,  960-454 mg/dL high and  greater than or equal to 500 mg/dL very high.     HDL   Date Value Ref Range Status   05/13/2022 53 MG/DL Final     Comment:     Based on NCEP ATP III, HDL Cholesterol <40 mg/dL is considered low and >09 mg/dL is elevated.     LDL Calculated   Date Value Ref Range Status   05/13/2022 110 (H) 0 - 100 MG/DL Final     Comment:     Based on the NCEP-ATP: LDL-C concentrations are considered  optimal <100 mg/dL,  near optimal/above Normal 100-129 mg/dL  Borderline High: 811-914, High: 160-189 mg/dL  Very High: Greater than or equal to 190 mg/dL           CARDIAC DIAGNOSTICS:     Cardiac Evaluation Includes:  I reviewed the test results below.  No results found for this or any previous visit.        No future appointments.      Kathaleen Bury, MD    [facility]  [facility]    [address]    [place], [place]  [address]    Ph: [phone]

## 2023-05-17 ENCOUNTER — Encounter (HOSPITAL_COMMUNITY): Payer: Self-pay

## 2023-05-17 ENCOUNTER — Other Ambulatory Visit: Payer: Self-pay

## 2023-05-17 ENCOUNTER — Emergency Department (HOSPITAL_COMMUNITY): Payer: Self-pay

## 2023-05-17 ENCOUNTER — Emergency Department (HOSPITAL_COMMUNITY)
Admission: EM | Admit: 2023-05-17 | Discharge: 2023-05-17 | Disposition: A | Discharge status: Home / Self Care | Payer: Self-pay | Attending: Emergency Medicine | Admitting: Emergency Medicine

## 2023-05-17 DIAGNOSIS — Z20822 Contact with and (suspected) exposure to covid-19: Secondary | ICD-10-CM | POA: Insufficient documentation

## 2023-05-17 DIAGNOSIS — J111 Influenza due to unidentified influenza virus with other respiratory manifestations: Secondary | ICD-10-CM

## 2023-05-17 DIAGNOSIS — R109 Unspecified abdominal pain: Secondary | ICD-10-CM

## 2023-05-17 LAB — CBC WITH DIFFERENTIAL/PLATELET
Abs Immature Granulocytes: 0.01 10*3/uL (ref 0.00–0.07)
Basophils Absolute: 0 10*3/uL (ref 0.0–0.1)
Basophils Relative: 0 %
Eosinophils Absolute: 0 10*3/uL (ref 0.0–0.5)
Eosinophils Relative: 0 %
HCT: 36 % (ref 36.0–46.0)
Hemoglobin: 11.9 g/dL — ABNORMAL LOW (ref 12.0–15.0)
Immature Granulocytes: 0 %
Lymphocytes Relative: 34 %
Lymphs Abs: 1.3 10*3/uL (ref 0.7–4.0)
MCH: 26 pg (ref 26.0–34.0)
MCHC: 33.1 g/dL (ref 30.0–36.0)
MCV: 78.6 fL — ABNORMAL LOW (ref 80.0–100.0)
Monocytes Absolute: 0.4 10*3/uL (ref 0.1–1.0)
Monocytes Relative: 11 %
Neutro Abs: 2 10*3/uL (ref 1.7–7.7)
Neutrophils Relative %: 55 %
Platelets: 214 10*3/uL (ref 150–400)
RBC: 4.58 MIL/uL (ref 3.87–5.11)
RDW: 14.5 % (ref 11.5–15.5)
WBC: 3.7 10*3/uL — ABNORMAL LOW (ref 4.0–10.5)
nRBC: 0 % (ref 0.0–0.2)

## 2023-05-17 LAB — RESP PANEL BY RT-PCR (RSV, FLU A&B, COVID)  RVPGX2
Influenza A by PCR: POSITIVE — AB
Influenza B by PCR: NEGATIVE
Resp Syncytial Virus by PCR: NEGATIVE
SARS Coronavirus 2 by RT PCR: NEGATIVE

## 2023-05-17 LAB — BASIC METABOLIC PANEL
Anion gap: 11 (ref 5–15)
BUN: 10 mg/dL (ref 6–20)
CO2: 23 mmol/L (ref 22–32)
Calcium: 8.9 mg/dL (ref 8.9–10.3)
Chloride: 102 mmol/L (ref 98–111)
Creatinine, Ser: 0.89 mg/dL (ref 0.44–1.00)
GFR, Estimated: >60 mL/min (ref >60)
Glucose, Bld: 84 mg/dL (ref 70–99)
Potassium: 3.6 mmol/L (ref 3.5–5.1)
Sodium: 136 mmol/L (ref 135–145)

## 2023-05-17 LAB — URINALYSIS, ROUTINE W REFLEX MICROSCOPIC
Bilirubin Urine: NEGATIVE
Glucose, UA: NEGATIVE mg/dL
Ketones, ur: 20 mg/dL — AB
Leukocytes,Ua: NEGATIVE
Nitrite: NEGATIVE
Protein, ur: 100 mg/dL — AB
Specific Gravity, Urine: 1.012 (ref 1.005–1.030)
pH: 5 (ref 5.0–8.0)

## 2023-05-17 LAB — TROPONIN I (HIGH SENSITIVITY): Troponin I (High Sensitivity): 7 ng/L (ref <18)

## 2023-05-17 MED ORDER — PROCHLORPERAZINE MALEATE 10 MG PO TABS: 10.0000 mg | ORAL_TABLET | Freq: Two times a day (BID) | ORAL | 0 refills | Status: AC | PRN

## 2023-05-17 MED ORDER — ACETAMINOPHEN 500 MG PO TABS: 1000.0000 mg | ORAL_TABLET | Freq: Three times a day (TID) | ORAL | 0 refills | Status: AC | PRN

## 2023-05-17 MED ORDER — PROCHLORPERAZINE MALEATE 10 MG PO TABS: 10.0000 mg | ORAL_TABLET | Freq: Two times a day (BID) | ORAL | 0 refills | Status: DC | PRN

## 2023-05-17 MED ORDER — DICYCLOMINE HCL 20 MG PO TABS: 20.0000 mg | ORAL_TABLET | Freq: Two times a day (BID) | ORAL | 0 refills | Status: AC

## 2023-05-17 MED ORDER — PROCHLORPERAZINE MALEATE 5 MG PO TABS
10.0000 mg | ORAL_TABLET | Freq: Once | ORAL | Status: AC
  Administered 2023-05-17: 10 mg via ORAL
  Filled 2023-05-17: qty 2

## 2023-05-17 MED ORDER — ACETAMINOPHEN 500 MG PO TABS: 1000.0000 mg | ORAL_TABLET | Freq: Three times a day (TID) | ORAL | 0 refills | Status: DC | PRN

## 2023-05-17 MED ORDER — ACETAMINOPHEN 500 MG PO TABS
1000.0000 mg | ORAL_TABLET | Freq: Once | ORAL | Status: AC
  Administered 2023-05-17: 1000 mg via ORAL
  Filled 2023-05-17: qty 2

## 2023-05-17 MED ORDER — DICYCLOMINE HCL 20 MG PO TABS: 20.0000 mg | ORAL_TABLET | Freq: Two times a day (BID) | ORAL | 0 refills | Status: DC

## 2023-05-17 NOTE — ED Triage Notes (Addendum)
Pt here for CP and abd pain Sunday. C/O nausea and diarrhea. Pt diagnosed with flu on Sunday. Denies shob.

## 2023-05-17 NOTE — ED Provider Triage Note (Signed)
Emergency Medicine Provider Triage Evaluation Note  April Morris , a 58 y.o. female  was evaluated in triage.  Pt complains of abd pain and CP x Sunday.  Review of Systems  Positive: Cough, runny nose, nausea, diarrhea, fever, chills, body aches Negative: HA, emesis, shortness of breath  Physical Exam  BP (!) 124/102 (BP Location: Left Arm)   Pulse 73   Temp 99.6 F (37.6 C) (Oral)   Resp 18   Ht 5\' 1"  (1.549 m)   Wt 52.6 kg   SpO2 98%   BMI 21.92 kg/m  Gen:   Awake, no distress   Resp:  Normal effort  MSK:   Moves extremities without difficulty  Other:    Medical Decision Making  Medically screening exam initiated at 12:25 PM.  Appropriate orders placed.  MICHA ERCK was informed that the remainder of the evaluation will be completed by another provider, this initial triage assessment does not replace that evaluation, and the importance of remaining in the ED until their evaluation is complete.  Labs and imaging ordered Compazine ordered   Dolphus Jenny, PA-C 05/17/23 1230

## 2023-05-17 NOTE — ED Provider Notes (Signed)
[place] EMERGENCY DEPARTMENT AT [facility] Provider Note   CSN: [id number] Arrival date & time: 05/17/23  1107     History Chief Complaint  Patient presents with   Chest Pain   Abdominal Pain    HPI April Morris is a 58 y.o. female presenting for chief complaint of chest pain, abdominal pain, cramping, nausea vomiting diarrhea since she was diagnosed with the flu on Tuesday. She is ambulatory tolerating p.o. intake at this time though states she gets very nauseous when she tries eat or drink.  Tolerating fluid in the emergency room. Unfortunately she had a 6-hour wait prior to my evaluation.  She was given Compazine and is now moderately improved.   Patient's recorded medical, surgical, social, medication list and allergies were reviewed in the Snapshot window as part of the initial history.   Review of Systems   Review of Systems  Constitutional:  Negative for chills and fever.  HENT:  Negative for ear pain and sore throat.   Eyes:  Negative for pain and visual disturbance.  Respiratory:  Negative for cough and shortness of breath.   Cardiovascular:  Negative for chest pain and palpitations.  Gastrointestinal:  Positive for abdominal pain, diarrhea and nausea. Negative for vomiting.  Genitourinary:  Negative for dysuria and hematuria.  Musculoskeletal:  Negative for arthralgias and back pain.  Skin:  Negative for color change and rash.  Neurological:  Negative for seizures and syncope.  All other systems reviewed and are negative.   Physical Exam Updated Vital Signs BP 109/75 (BP Location: Right Arm)   Pulse 80   Temp (!) 100.6 F (38.1 C) (Oral)   Resp 16   Ht 5\' 1"  (1.549 m)   Wt 52.6 kg   SpO2 97%   BMI 21.92 kg/m  Physical Exam Vitals and nursing note reviewed.  Constitutional:      General: She is not in acute distress.    Appearance: She is well-developed.  HENT:     Head: Normocephalic and atraumatic.  Eyes:     Conjunctiva/sclera:  Conjunctivae normal.  Cardiovascular:     Rate and Rhythm: Normal rate and regular rhythm.     Heart sounds: No murmur heard. Pulmonary:     Effort: Pulmonary effort is normal. No respiratory distress.     Breath sounds: Normal breath sounds.  Abdominal:     General: There is no distension.     Palpations: Abdomen is soft.     Tenderness: There is no abdominal tenderness. There is no right CVA tenderness or left CVA tenderness.  Musculoskeletal:        General: No swelling or tenderness. Normal range of motion.     Cervical back: Neck supple.  Skin:    General: Skin is warm and dry.  Neurological:     General: No focal deficit present.     Mental Status: She is alert and oriented to person, place, and time. Mental status is at baseline.     Cranial Nerves: No cranial nerve deficit.      ED Course/ Medical Decision Making/ A&P    Procedures Procedures   Medications Ordered in ED Medications  acetaminophen (TYLENOL) tablet 1,000 mg (has no administration in time range)  prochlorperazine (COMPAZINE) tablet 10 mg (10 mg Oral Given 05/17/23 1257)   Medical Decision Making:   April Morris is a 58 y.o. female who presented to the ED today with subjective fever, cough, congestion detailed above.  Complete initial physical exam performed, notably the patient  was hemodynamically stable in no acute distress.  Posterior oropharynx illuminated and without obvious swelling or deformity.  Patient is without neck stiffness.    Reviewed and confirmed nursing documentation for past medical history, family history, social history.    Initial Assessment:   With the patient's presentation of fever cough congestion, most likely diagnosis is developing viral upper respiratory infection. Other diagnoses were considered including (but not limited to) peritonsillar abscess, retropharyngeal abscess, pneumonia. These are considered less likely due to history of present illness and physical exam  findings.   This is most consistent with an acute life/limb threatening illness complicated by underlying chronic conditions. Considered meningitis, however patient's symptoms, vital signs, physical exam findings including lack of meningismus seem grossly less consistent at this time. Initial Plan:  Screening labs including CBC and Metabolic panel to evaluate for infectious or metabolic etiology of disease.  Viral screening including COVID/flu testing to evaluate for common viral etiologies that need to be tracked CXR to evaluate for structural/infectious intrathoracic pathology.  Empiric treatment with antipyretics including acetaminophen in ambulatory setting Objective evaluation as below reviewed   Initial Study Results:   Laboratory  All laboratory results reviewed without evidence of clinically relevant pathology.    Radiology:  All images reviewed independently. Agree with radiology report at this time.   DG Chest 2 View  Final Result         Final Assessment and Plan:   On reassessment, patient is ambulatory tolerating p.o. intake in no acute distress.   Notably, patient is outside the treatment window for Tamiflu.  She is in no acute distress and overall very well-appearing on my exam.  Mild fever starting at this time.  Will send her on antipyretics, antiemetics and Bentyl for symptom management. As she endorsed abdominal pain, considered appendicitis, cholecystitis, small bowel obstruction.  However she is tolerating p.o. intake, lab work is benign and her exam is benign at this time. Patient is currently stable for outpatient care and management with no indication for hospitalization or transfer at this time.  Discussed all findings with patient expressed understanding.  Disposition:  Based on the above findings, I believe patient is stable for discharge.    Patient/family educated about specific return precautions for given chief complaint and symptoms.  Patient/family  educated about follow-up with PCP.     Patient/family expressed understanding of return precautions and need for follow-up. Patient spoken to regarding all imaging and laboratory results and appropriate follow up for these results. All education provided in verbal form with additional information in written form. Time was allowed for answering of patient questions. Patient discharged.    Emergency Department Medication Summary:   Medications  acetaminophen (TYLENOL) tablet 1,000 mg (has no administration in time range)  prochlorperazine (COMPAZINE) tablet 10 mg (10 mg Oral Given 05/17/23 1257)        Clinical Impression:  1. Abdominal pain, unspecified abdominal location   2. Flu      Discharge   Final Clinical Impression(s) / ED Diagnoses Final diagnoses:  Abdominal pain, unspecified abdominal location  Flu    Rx / DC Orders ED Discharge Orders          Ordered    prochlorperazine (COMPAZINE) 10 MG tablet  2 times daily PRN        05/17/23 1716    dicyclomine (BENTYL) 20 MG tablet  2 times daily        05/17/23 1716  acetaminophen (TYLENOL) 500 MG tablet  Every 8 hours PRN        05/17/23 1716              Glyn Ade, MD 05/17/23 [id number]

## 2023-07-14 ENCOUNTER — Encounter

## 2023-07-14 MED ORDER — LISINOPRIL 5 MG PO TABS
5 | ORAL_TABLET | Freq: Every day | ORAL | 0 refills | Status: DC
Start: 2023-07-14 — End: 2023-11-14

## 2023-07-14 NOTE — Telephone Encounter (Signed)
 Per verbal order from Dr. Otilia Bloch  Last appt: 07/26/2022     No future appointments.    Requested Prescriptions     Signed Prescriptions Disp Refills    lisinopril (PRINIVIL;ZESTRIL) 5 MG tablet 90 tablet 0     Sig: Take 1 tablet by mouth daily Please call for appt for further refills     Authorizing Provider: Adriana Hopping     Ordering User: Caprice Chance

## 2023-07-24 ENCOUNTER — Encounter: Payer: BLUE CROSS/BLUE SHIELD | Attending: Internal Medicine

## 2023-11-10 ENCOUNTER — Encounter

## 2023-11-14 MED ORDER — LISINOPRIL 5 MG PO TABS
5 | ORAL_TABLET | Freq: Every day | ORAL | 0 refills | 90.00000 days | Status: AC
Start: 2023-11-14 — End: ?

## 2023-11-14 NOTE — Telephone Encounter (Signed)
 Per verbal order from Dr. Karna Pyo  Last appt: 07/26/2022     No future appointments.    Requested Prescriptions     Signed Prescriptions Disp Refills    lisinopril  (PRINIVIL ;ZESTRIL ) 5 MG tablet 90 tablet 0     Sig: Take 1 tablet by mouth daily Appt needed for further refills     Authorizing Provider: PYO KARNA HERO     Ordering User: ANTONINA DOMINO

## 2023-11-23 MED ORDER — LOSARTAN POTASSIUM 25 MG PO TABS
25 | ORAL_TABLET | Freq: Every day | ORAL | 0 refills | 90.00000 days | Status: DC
Start: 2023-11-23 — End: 2024-02-19

## 2023-11-23 NOTE — Telephone Encounter (Signed)
 Per verbal order from Dr. Karna Pyo  Last appt: 07/26/2022     Future Appointments   Date Time Provider Department Center   02/26/2024  3:00 PM Pyo Karna HERO, MD [facility]       Requested Prescriptions     Signed Prescriptions Disp Refills    losartan (COZAAR) 25 MG tablet 90 tablet 0     Sig: Take 1 tablet by mouth daily     Authorizing Provider: PYO KARNA HERO     Ordering User: ANTONINA DOMINO

## 2023-11-30 NOTE — Telephone Encounter (Signed)
 Left VM to check her my chart for response as my chart response has not been viewed.

## 2024-02-19 MED ORDER — LOSARTAN POTASSIUM 25 MG PO TABS
25 | ORAL_TABLET | Freq: Every day | ORAL | 0 refills | 90.00000 days | Status: AC
Start: 2024-02-19 — End: ?

## 2024-02-19 NOTE — Telephone Encounter (Signed)
"  Per verbal order from Dr. Karna Pyo  Last appt: 07/26/2022     Future Appointments   Date Time Provider Department Center   02/26/2024  3:00 PM Pyo Karna HERO, MD [facility]       Requested Prescriptions     Signed Prescriptions Disp Refills    losartan  (COZAAR ) 25 MG tablet 90 tablet 0     Sig: Take 1 tablet by mouth once daily     Authorizing Provider: PYO KARNA HERO     Ordering User: ANTONINA DOMINO           "

## 2024-02-26 ENCOUNTER — Ambulatory Visit: Payer: BLUE CROSS/BLUE SHIELD | Attending: Internal Medicine

## 2024-02-26 NOTE — Progress Notes (Deleted)
 "                Patient: Krista Werner  DOB: 09/19/1965    Primary Cardiologist: Karna CHRISTELLA Pyo, MD  EP Cardiologist:  PCP: No, Pcp    Today's Date: 02/26/2024      ASSESSMENT AND PLAN:     Assessment and Plan:  SOB/palpitations  Echo: Left Ventricle: Normal left ventricular systolic function with a visually estimated EF of 60 - 65%. EF by 2D Simpsons Biplane is 63%. Left ventricle size is normal. Normal wall thickness. Normal wall motion. Normal diastolic function.    Mitral Valve: Mild regurgitation.    Image quality is technically difficult. Contrast used: Definity . Technically difficult study with poor endocardial visualization.  7 day holter:  Patient monitored for 6d 19h, analyzable time was 6d 19h starting on 06/20/2022 11:50 am.   Primary rhythm was Sinus Rhythm. Average heart rate was 70 bpm, Minimum heart rate was 50 bpm on Day 7 / 03:55:52 am, Max heart rate was 132 bpm on Day 3 / 07:11:08 am   SVE(s): Burden was 0.05 %, 342 total SVE(s)   SV Arrhythmia(s): 16 event(s), longest event 11 beats on Day 6 / 06:50:37 pm, fastest event 146 bpm on Day 2 / 03:42:32 pm   PVC(s): Burden was < 0.01 %, 5 total PVC(s), 3 disparate morphologies   Patient recorded 7 event(s) during the monitoring period   CCS 18  LDL 110  Discussed risks and benefits of statins - she would like to retry lifestyle changes    2. HTN  Controlled on lisinopril  5 (0.5 10mg )    Follow up one year.       ICD-10-CM    1. Hypertension, unspecified type  I10       2. Palpitation  R00.2       3. Shortness of breath  R06.02       4. Cardiac risk counseling  Z71.89       5. Other fatigue  R53.83               HISTORY OF PRESENT ILLNESS:     History of Present Illness:  Krista Werner is a 58 y.o. female here to re-establihs care.  Seen at my prior practie 2018.  Nuc stress at that time rpeortedly normal (do not have results to review)    Dizzy SOB, BP up and down.  Mother COPD 69 yoa.      FH CAD.    Aunt CAD.      Stress - lost daughter.   Family  dynamics.      EKG NSR.      PAST MEDICAL HISTORY:     No past medical history on file.    No past surgical history on file.    CURRENT MEDICATIONS:    .  Current Outpatient Medications   Medication Sig Dispense Refill    losartan  (COZAAR ) 25 MG tablet Take 1 tablet by mouth once daily 90 tablet 0    omeprazole (PRILOSEC) 40 MG delayed release capsule TAKE 1 CAPSULE BY MOUTH ONCE DAILY NEEDS A FOLLOW UP APPOINTMENT WITH PCP FOR FUTURE REFILLS      sertraline (ZOLOFT) 50 MG tablet Take 1 tablet by mouth daily       No current facility-administered medications for this visit.       No Known Allergies    SOCIAL HISTORY:     Social History     Tobacco Use  Smoking status: Never    Smokeless tobacco: Never       FAMILY HISTORY:     No family history on file.    REVIEW OF SYMPTOMS:     Review of Symptoms:  Negative except as above, all other systems reviewed and are negative for a Comprehensive ROS (10+)    PHYSICAL EXAM:     Physical Exam:  There were no vitals taken for this visit.    General: alert, cooperative, no distress, appears stated age  Neck: supple, symmetrical, trachea midline, no adenopathy, thyroid: not enlarged, symmetric, no tenderness/mass/nodules, no carotid bruit, and no JVD  Lungs: clear to auscultation bilaterally  Heart: regular rate and rhythm, S1, S2 normal, no murmur, no click, rub or gallop  Abdomen: soft, non tender  Extremities: extremities normal, atraumatic, no cyanosis or edema  Skin: No significant rashes  MSKTL: Overall good ROM ext  Neuro: Grossly intact  Psych: Appropriate affect    LABS / OTHER STUDIES:     Lab Results   Component Value Date/Time    NA 136 05/13/2022 02:46 PM    K 4.1 05/13/2022 02:46 PM    CL 104 05/13/2022 02:46 PM    CO2 27 05/13/2022 02:46 PM    BUN 9 05/13/2022 02:46 PM    GLOB 3.1 05/13/2022 02:46 PM    ALT 28 05/13/2022 02:46 PM    AST 24 05/13/2022 02:46 PM       Lab Results   Component Value Date/Time    CHOL 186 05/13/2022 02:46 PM    HDL 53 05/13/2022  02:46 PM    LDL 110 05/13/2022 02:46 PM    VLDL 23 05/13/2022 02:46 PM       Cholesterol, Total   Date Value Ref Range Status   05/13/2022 186 <200 MG/DL Final     Triglycerides   Date Value Ref Range Status   05/13/2022 115 <150 MG/DL Final     Comment:     Based on NCEP-ATP III:  Triglycerides <150 mg/dL  is considered normal, 150-199 mg/dL  borderline high,  799-500 mg/dL high and  greater than or equal to 500 mg/dL very high.     HDL   Date Value Ref Range Status   05/13/2022 53 MG/DL Final     Comment:     Based on NCEP ATP III, HDL Cholesterol <40 mg/dL is considered low and >39 mg/dL is elevated.         CARDIAC DIAGNOSTICS:     Cardiac Evaluation Includes:  I reviewed the test results below.  No results found for this or any previous visit.        Future Appointments   Date Time Provider Department Center   02/26/2024  3:00 PM Elenor Karna HERO, MD [facility]         Karna HERO Elenor, MD    [facility] Cardiology  [facility]    [address] [place], [address]    [place], [place]   [address]    Ph: [phone]    "
